# Patient Record
Sex: Female | Born: 1955 | Race: White | Hispanic: No | Marital: Married | Smoking: Never smoker
Health system: Southern US, Community
[De-identification: ages and names within clinical notes are randomized; demographics above are authoritative.]

## PROBLEM LIST (undated history)

## (undated) DIAGNOSIS — F32A Depression, unspecified: Secondary | ICD-10-CM

## (undated) DIAGNOSIS — M810 Age-related osteoporosis without current pathological fracture: Secondary | ICD-10-CM

## (undated) DIAGNOSIS — I7300 Raynaud's syndrome without gangrene: Secondary | ICD-10-CM

## (undated) HISTORY — DX: Raynaud's syndrome without gangrene: I73.00

## (undated) HISTORY — DX: Depression, unspecified: F32.A

## (undated) HISTORY — PX: HERNIA REPAIR: SHX51

## (undated) HISTORY — DX: Age-related osteoporosis without current pathological fracture: M81.0

## (undated) HISTORY — PX: TONSILECTOMY/ADENOIDECTOMY WITH MYRINGOTOMY: SHX6125

## (undated) HISTORY — PX: ABDOMINAL HYSTERECTOMY: SHX81

---

## 2012-04-28 DIAGNOSIS — D649 Anemia, unspecified: Secondary | ICD-10-CM | POA: Insufficient documentation

## 2014-06-04 DIAGNOSIS — M810 Age-related osteoporosis without current pathological fracture: Secondary | ICD-10-CM | POA: Insufficient documentation

## 2015-12-04 DIAGNOSIS — G8929 Other chronic pain: Secondary | ICD-10-CM | POA: Insufficient documentation

## 2015-12-04 DIAGNOSIS — F5101 Primary insomnia: Secondary | ICD-10-CM | POA: Insufficient documentation

## 2015-12-04 DIAGNOSIS — E559 Vitamin D deficiency, unspecified: Secondary | ICD-10-CM | POA: Insufficient documentation

## 2019-04-04 DIAGNOSIS — F331 Major depressive disorder, recurrent, moderate: Secondary | ICD-10-CM | POA: Insufficient documentation

## 2019-04-04 DIAGNOSIS — F411 Generalized anxiety disorder: Secondary | ICD-10-CM | POA: Insufficient documentation

## 2019-08-10 DIAGNOSIS — D490 Neoplasm of unspecified behavior of digestive system: Secondary | ICD-10-CM | POA: Insufficient documentation

## 2019-11-08 DIAGNOSIS — R911 Solitary pulmonary nodule: Secondary | ICD-10-CM | POA: Insufficient documentation

## 2021-01-13 DIAGNOSIS — K644 Residual hemorrhoidal skin tags: Secondary | ICD-10-CM | POA: Insufficient documentation

## 2021-01-13 DIAGNOSIS — K602 Anal fissure, unspecified: Secondary | ICD-10-CM | POA: Insufficient documentation

## 2021-04-15 DIAGNOSIS — K219 Gastro-esophageal reflux disease without esophagitis: Secondary | ICD-10-CM | POA: Insufficient documentation

## 2021-07-07 ENCOUNTER — Telehealth: Payer: Self-pay

## 2021-07-07 NOTE — Telephone Encounter (Signed)
Copied from [place] [id number]. Topic: Appointment Scheduling - New Patient >> Jul 07, 2021 11:13 AM Parke Poisson wrote: New patient has been scheduled for your office. Provider: Teodora Medici Date of Appointment: New pt  Route to department's PEC pool.

## 2021-07-24 ENCOUNTER — Telehealth: Payer: Self-pay | Admitting: Nurse Practitioner

## 2021-08-06 ENCOUNTER — Ambulatory Visit (INDEPENDENT_AMBULATORY_CARE_PROVIDER_SITE_OTHER): Payer: BLUE CROSS/BLUE SHIELD | Admitting: Nurse Practitioner

## 2021-08-06 ENCOUNTER — Other Ambulatory Visit: Payer: Self-pay

## 2021-08-06 ENCOUNTER — Encounter: Payer: Self-pay | Admitting: Nurse Practitioner

## 2021-08-06 VITALS — BP 112/72 | HR 82 | Temp 98.5°F | Resp 18 | Ht 67.0 in | Wt 123.3 lb

## 2021-08-06 DIAGNOSIS — M1990 Unspecified osteoarthritis, unspecified site: Secondary | ICD-10-CM | POA: Diagnosis not present

## 2021-08-06 DIAGNOSIS — F331 Major depressive disorder, recurrent, moderate: Secondary | ICD-10-CM

## 2021-08-06 DIAGNOSIS — E785 Hyperlipidemia, unspecified: Secondary | ICD-10-CM

## 2021-08-06 DIAGNOSIS — Z7689 Persons encountering health services in other specified circumstances: Secondary | ICD-10-CM

## 2021-08-06 DIAGNOSIS — F411 Generalized anxiety disorder: Secondary | ICD-10-CM

## 2021-08-06 DIAGNOSIS — M3501 Sicca syndrome with keratoconjunctivitis: Secondary | ICD-10-CM

## 2021-08-06 DIAGNOSIS — M810 Age-related osteoporosis without current pathological fracture: Secondary | ICD-10-CM

## 2021-08-06 DIAGNOSIS — R911 Solitary pulmonary nodule: Secondary | ICD-10-CM

## 2021-08-06 NOTE — Progress Notes (Addendum)
BP 112/72    Pulse 82    Temp 98.5 F (36.9 C) (Oral)    Resp 18    Ht 5\' 7"  (1.702 m)    Wt 123 lb 4.8 oz (55.9 kg)    SpO2 99%    BMI 19.31 kg/m    Subjective:    Patient ID: Marissa Shannon, female    DOB: 12-20-1955, 65 y.o.   MRN: [id number]  HPI: Marissa Shannon is a 65 y.o. female  Chief Complaint  Patient presents with   Establish Care   Establish care : She moved from [place] to be closer to family. Up to date on physical.    Depression/anxiety:She says she was originally placed on medication when her life was very stressful.  She was caring for her mother and working full time. She wants to wean off Lexapro.  She currently has 20 mg pills, she is going to start by cutting those in half and seeing if her mood continues to be stabilized.  She does also use Buspar as needed.  She says she has not taken it in awhile.    Depression screen [facility] 2/9 08/06/2021  Decreased Interest 0  Down, Depressed, Hopeless 0  PHQ - 2 Score 0  Altered sleeping 0  Tired, decreased energy 0  Change in appetite 0  Feeling bad or failure about yourself  0  Trouble concentrating 0  Moving slowly or fidgety/restless 0  Suicidal thoughts 0  PHQ-9 Score 0    GAD 7 : Generalized Anxiety Score 08/06/2021  Nervous, Anxious, on Edge 0  Control/stop worrying 0  Worry too much - different things 0  Trouble relaxing 0  Restless 0  Easily annoyed or irritable 0  Afraid - awful might happen 0  Total GAD 7 Score 0  Anxiety Difficulty Not difficult at all     Arthritis/Sjogren's syndrome: : She says that she has arthritis in her hands, elbows and knees.  She says her mom also had it.  She says she does not taken anything for it.  She has seen rheumatology. She was on plaquenil but she was not able to tolerate it.  She says mostly she has dry mouth but controls it by drinking water.    Hyperlipidemia:  Discussed lab results from 04/15/21.  Discussed ASCVD risk score and dietary changes that can help  improve her cholesterol.  Her LDL was 117 on 04/15/21.  The 10-year ASCVD risk score (Arnett DK, et al., 2019) is: 3.5%   Values used to calculate the score:     Age: 30 years     Sex: Female     Is Non-Hispanic African American: No     Diabetic: No     Tobacco smoker: No     Systolic Blood Pressure: 299 mmHg     Is BP treated: No     HDL Cholesterol: 71 mg/dL     Total Cholesterol: 203 mg/dL   Osteoporosis: This was noted on a bone density scan 04/2019.  She is currently on Fosamax 70 mg daily. She also takes vitamin D 5000 units daily.  No falls or fractures.  Due for next bone density next year.   Pulmonary nodule: Ct scan done on 06/25/21, showed a stable appearance of the 1 cm ground glass opacity seen in the anterior right upper lobe since 12/12/2020. Second pulmonary nodule noted on RLL 4 mm. Saw pulmonology on 07/16/21. Recommended repeat scan in one year and then every two years  for 5 years.   Relevant past medical, surgical, family and social history reviewed and updated as indicated. Interim medical history since our last visit reviewed. Allergies and medications reviewed and updated.  Review of Systems  Constitutional: Negative for fever or weight change.  Respiratory: Negative for cough and shortness of breath.   Cardiovascular: Negative for chest pain or palpitations.  Gastrointestinal: Negative for abdominal pain, no bowel changes.  Musculoskeletal: Negative for gait problem or joint swelling.  Skin: Negative for rash.  Neurological: Negative for dizziness or headache.  No other specific complaints in a complete review of systems (except as listed in HPI above).      Objective:    BP 112/72    Pulse 82    Temp 98.5 F (36.9 C) (Oral)    Resp 18    Ht 5\' 7"  (1.702 m)    Wt 123 lb 4.8 oz (55.9 kg)    SpO2 99%    BMI 19.31 kg/m   Wt Readings from Last 3 Encounters:  08/06/21 123 lb 4.8 oz (55.9 kg)    Physical Exam  Constitutional: Patient appears well-developed and  well-nourished. No distress.  HEENT: head atraumatic, normocephalic, pupils equal and reactive to light,  neck supple Cardiovascular: Normal rate, regular rhythm and normal heart sounds.  No murmur heard. No BLE edema. Pulmonary/Chest: Effort normal and breath sounds normal. No respiratory distress. Abdominal: Soft.  There is no tenderness. Psychiatric: Patient has a normal mood and affect. behavior is normal. Judgment and thought content normal.     Assessment & Plan:   1. Moderate episode of recurrent major depressive disorder (HCC) -decrease lexapro to 10 mg daily follow-up   2. Generalized anxiety disorder -continue to use Buspar prn  3. Arthritis -continue current treatment plan  4. Sjogren's syndrome with keratoconjunctivitis sicca ([place]) -continue current treatment plan  5. Hyperlipidemia, unspecified hyperlipidemia type -increase physical activity, decrease saturated fats in your diet  6. Age-related osteoporosis without current pathological fracture -continue current treatment  7. Pulmonary nodule -will do repeat ct scan next year  8. Encounter to establish care -cpe in 8 months  Follow up plan: Return in about 4 weeks (around 09/03/2021) for follow up.

## 2021-09-22 ENCOUNTER — Ambulatory Visit: Payer: Self-pay | Admitting: Internal Medicine

## 2021-11-11 ENCOUNTER — Encounter: Payer: Self-pay | Admitting: Family Medicine

## 2021-11-11 ENCOUNTER — Encounter: Payer: Self-pay | Admitting: Nurse Practitioner

## 2021-11-11 ENCOUNTER — Telehealth (INDEPENDENT_AMBULATORY_CARE_PROVIDER_SITE_OTHER): Payer: 59 | Admitting: Family Medicine

## 2021-11-11 DIAGNOSIS — J329 Chronic sinusitis, unspecified: Secondary | ICD-10-CM

## 2021-11-11 DIAGNOSIS — B9689 Other specified bacterial agents as the cause of diseases classified elsewhere: Secondary | ICD-10-CM | POA: Diagnosis not present

## 2021-11-11 MED ORDER — AMOXICILLIN-POT CLAVULANATE 875-125 MG PO TABS: 1.0000 | ORAL_TABLET | Freq: Two times a day (BID) | ORAL | 0 refills | Status: AC

## 2021-11-11 NOTE — Progress Notes (Signed)
Virtual Visit via Video Note ? ?I connected with [name] on 11/11/21 at  1:40 PM EDT by a video enabled telemedicine application and verified that I am speaking with the correct person using two identifiers. ? ?Location: ?Patient: son's house ?Provider: [facility] ?  ?I discussed the limitations of evaluation and management by telemedicine and the availability of in person appointments. The patient expressed understanding and agreed to proceed. ? ?History of Present Illness: ? ?UPPER RESPIRATORY TRACT INFECTION ?- symptom onset 2 weeks ago ?- has not tested for COVID. No known COVID contacts.  ? ?Fever: yes, Tmax 102F with body aches ?Cough: yes, productive ?Shortness of breath: no ?Chest pain: no ?Chest tightness: no ?Chest congestion: yes ?Nasal congestion: yes ?Runny nose: yes, thick mucus ?Sore throat: yes ?Sinus pressure: yes ?Headache: yes ?Face pain: yes ?Toothache: yes ?Ear pain: no  ?Ear pressure: no  ?Eyes red/itching:no ?Eye drainage/crusting: no  ?Vomiting: no, some nausea ?Rash: no ?Fatigue: yes ?Sick contacts: yes ?Relief with OTC cold/cough medications:  a little   ?Treatments attempted:  alka-seltzer cold-flu ? ?  ?Observations/Objective: ? ?Well appearing, in NAD. Speaks in full sentences. Comfortable WOB on RA. No resp distress.  ? ?Assessment and Plan: ? ?Acute Sinusitis ?Reasonable to treat with antibiotics given duration of symptoms and prolonged fever. Rx augmentin. F/u in person if no better after treatment.  ?  ?I discussed the assessment and treatment plan with the patient. The patient was provided an opportunity to ask questions and all were answered. The patient agreed with the plan and demonstrated an understanding of the instructions. ?  ?The patient was advised to call back or seek an in-person evaluation if the symptoms worsen or if the condition fails to improve as anticipated. ? ?I provided 8 minutes of non-face-to-face time during this encounter. ? ? ?Myles Gip, DO ?

## 2021-11-24 ENCOUNTER — Telehealth: Payer: Self-pay

## 2021-11-24 ENCOUNTER — Encounter: Payer: Self-pay | Admitting: Nurse Practitioner

## 2021-11-24 ENCOUNTER — Encounter: Payer: Self-pay | Admitting: Family Medicine

## 2021-11-24 NOTE — Telephone Encounter (Signed)
Pt was seen ono 4.4.2023 and her insurance was not on file. Please resubmit she stopped by and gave [place] her insurance information. Thank you

## 2021-11-24 NOTE — Telephone Encounter (Signed)
Spoke with pt and informed her that it is not showing in the system that she has uploaded the insurance information. I put the information in for her and she will stop by sometime today to being the card to scan it in.

## 2021-11-26 ENCOUNTER — Encounter: Payer: Self-pay | Admitting: Family Medicine

## 2021-11-26 ENCOUNTER — Ambulatory Visit
Admission: RE | Admit: 2021-11-26 | Discharge: 2021-11-26 | Disposition: A | Discharge status: Home / Self Care | Payer: 59 | Attending: Family Medicine | Admitting: Family Medicine

## 2021-11-26 ENCOUNTER — Ambulatory Visit (INDEPENDENT_AMBULATORY_CARE_PROVIDER_SITE_OTHER): Payer: 59 | Admitting: Family Medicine

## 2021-11-26 ENCOUNTER — Ambulatory Visit: Payer: Self-pay

## 2021-11-26 ENCOUNTER — Ambulatory Visit
Admission: RE | Admit: 2021-11-26 | Discharge: 2021-11-26 | Disposition: A | Discharge status: Home / Self Care | Payer: 59 | Source: Ambulatory Visit | Attending: Family Medicine | Admitting: Family Medicine

## 2021-11-26 VITALS — BP 102/64 | HR 98 | Temp 98.5°F | Resp 16 | Ht 67.5 in | Wt 125.5 lb

## 2021-11-26 DIAGNOSIS — R0781 Pleurodynia: Secondary | ICD-10-CM | POA: Insufficient documentation

## 2021-11-26 DIAGNOSIS — R059 Cough, unspecified: Secondary | ICD-10-CM

## 2021-11-26 DIAGNOSIS — R911 Solitary pulmonary nodule: Secondary | ICD-10-CM | POA: Diagnosis not present

## 2021-11-26 DIAGNOSIS — J209 Acute bronchitis, unspecified: Secondary | ICD-10-CM | POA: Diagnosis not present

## 2021-11-26 MED ORDER — ALBUTEROL SULFATE HFA 108 (90 BASE) MCG/ACT IN AERS
2.0000 | INHALATION_SPRAY | Freq: Four times a day (QID) | RESPIRATORY_TRACT | 0 refills | Status: AC | PRN
Start: 2021-11-26 — End: ?

## 2021-11-26 MED ORDER — ALBUTEROL SULFATE (2.5 MG/3ML) 0.083% IN NEBU
2.5000 mg | INHALATION_SOLUTION | Freq: Once | RESPIRATORY_TRACT | Status: AC
  Administered 2021-11-26: 2.5 mg via RESPIRATORY_TRACT

## 2021-11-26 MED ORDER — PREDNISONE 20 MG PO TABS: 40.0000 mg | ORAL_TABLET | Freq: Every day | ORAL | 0 refills | Status: AC

## 2021-11-26 NOTE — Telephone Encounter (Signed)
?  Chief Complaint: cough ?Symptoms: non productive cough, severe, back pain, unable to take full inhalation without pain ?Frequency: going on 1 month ?Pertinent Negatives: Patient denies SOB ?Disposition: '[]'$ ED /'[]'$ Urgent Care (no appt availability in office) / '[x]'$ Appointment(In office/virtual)/ '[]'$  [facility]/ '[]'$ Home Care/ '[]'$ Refused Recommended Disposition /'[]'$ [facility]/ '[]'$  Follow-up with PCP ?Additional Notes: pt states she has completed her abx and been taking OTC cough medicine but still unable to shake the cough and everyone in the home is better now. Pt advised to come in for OV to assess lungs and r/o bronchitis or PNA. Appt scheduled today at 1400.  ? ?Summary: ongoing cough  ? Pt called in stating she was recently seen and has a severe cough still and wanted to know what all she can take, please advise.   ?  ? ? ?Reason for Disposition ? SEVERE coughing spells (e.g., whooping sound after coughing, vomiting after coughing) ? ?Answer Assessment - Initial Assessment Questions ?1. ONSET: "When did the cough begin?"  ?    Mid March  ?3. SPUTUM: "Describe the color of your sputum" (none, dry cough; clear, white, yellow, green) ?    No dry hacky cough ?5. DIFFICULTY BREATHING: "Are you having difficulty breathing?" If Yes, ask: "How bad is it?" (e.g., mild, moderate, severe)  ?  - MILD: No SOB at rest, mild SOB with walking, speaks normally in sentences, can lie down, no retractions, pulse < 100.  ?  - MODERATE: SOB at rest, SOB with minimal exertion and prefers to sit, cannot lie down flat, speaks in phrases, mild retractions, audible wheezing, pulse 100-120.  ?  - SEVERE: Very SOB at rest, speaks in single words, struggling to breathe, sitting hunched forward, retractions, pulse > 120  ?    Mild  ?10. OTHER SYMPTOMS: "Do you have any other symptoms?" (e.g., runny nose, wheezing, chest pain) ?      Unable to take full deep breath, back pain ? ?Protocols used: Cough - Acute  Productive-A-AH ? ?

## 2021-11-26 NOTE — Progress Notes (Addendum)
? ? ?Patient ID: Marissa Shannon, female    DOB: 28-Mar-1956, 66 y.o.   MRN: [id number] ? ?PCP: Bo Merino, FNP ? ?Chief Complaint  ?Patient presents with  ? Cough  ?  Onset for over a month, at the beginning it was productive cough and now its dry. Pt states hurts mid chest when coughing and even her back.  ? ? ?Subjective:  ? ?Marissa Shannon is a 66 y.o. female, presents to clinic with CC of the following: ? ?HPI  ?Patient presents for over a month of coughing.  She states she lives with one of her children and their children about 4 kids and 4 adults in the house altogether and roughly 1 month ago they all got sick with a upper respiratory viral infection, several were seen by their PCPs and some had testing all were negative for flu & COVID ?At that time they all had fevers she reports about 2 weeks of fevers with a Tmax of 103 ?Fever stopped and she reports no recent fever or sweats but she has had continued cough.  It was productive and has now become more harsh and barking and dry ?She did try her daughter's inhaler and that helped temporarily ?She reports no history of asthma, bronchitis or significant smoking exposure or personal smoking history ?She has coughing fits she feels that her chest is sore from coughing but also she feels some discomfort with deep inspiration it feels tight and she feels somewhat wheezy and short of breath ? ?She recently established here with Almyra Free NP - no weight loss ?No hemoptysis ?She is still having some scratchy throat ?Wt Readings from Last 5 Encounters:  ?11/26/21 125 lb 8 oz (56.9 kg)  ?08/06/21 123 lb 4.8 oz (55.9 kg)  ? ?BMI Readings from Last 5 Encounters:  ?11/26/21 19.37 kg/m?  ?08/06/21 19.31 kg/m?  ? ? ? ?Patient Active Problem List  ? Diagnosis Date Noted  ? Gastroesophageal reflux disease without esophagitis 04/15/2021  ? Anal fissure 01/13/2021  ? External hemorrhoid 01/13/2021  ? Pulmonary nodule 11/08/2019  ? IPMN (intraductal papillary mucinous neoplasm)  08/10/2019  ? Generalized anxiety disorder 04/04/2019  ? Moderate episode of recurrent major depressive disorder ([place]) 04/04/2019  ? Chronic bilateral low back pain without sciatica 12/04/2015  ? Primary insomnia 12/04/2015  ? Vitamin D insufficiency 12/04/2015  ? Age-related osteoporosis without current pathological fracture 06/04/2014  ? Anemia 04/28/2012  ? Sjogren's syndrome ([place]) 04/28/2012  ? ? ? ? ?Current Outpatient Medications:  ?  alendronate (FOSAMAX) 70 MG tablet, Take by mouth., Disp: , Rfl:  ?  busPIRone (BUSPAR) 15 MG tablet, Take by mouth., Disp: , Rfl:  ?  Cholecalciferol 125 MCG (5000 UT) TABS, Take by mouth., Disp: , Rfl:  ?  cycloSPORINE (RESTASIS) 0.05 % ophthalmic emulsion, Administer 1 drop to both eyes 2 (two) times a day, Disp: , Rfl:  ?  Docusate Sodium (DSS) 100 MG CAPS, Take by mouth., Disp: , Rfl:  ?  hydrocortisone 2.5 % cream, Apply rectally 2 times daily, Disp: , Rfl:  ?  omeprazole (PRILOSEC) 40 MG capsule, Take by mouth., Disp: , Rfl:  ?  triamcinolone cream (KENALOG) 0.1 %, Apply topically., Disp: , Rfl:  ?  escitalopram (LEXAPRO) 20 MG tablet, Take by mouth. (Patient not taking: Reported on 11/11/2021), Disp: , Rfl:  ? ? ?Allergies  ?Allergen Reactions  ? Hydroxychloroquine Rash  ? ? ? ?Social History  ? ?Tobacco Use  ? Smoking status: Never  ? Smokeless  tobacco: Never  ?Vaping Use  ? Vaping Use: Never used  ?Substance Use Topics  ? Alcohol use: Never  ? Drug use: Never  ?  ? ? ?Chart Review Today: ?I personally reviewed active problem list, medication list, allergies, family history, social history, health maintenance, notes from last encounter, lab results, imaging with the patient/caregiver today. ? ? ?Review of Systems  ?Constitutional: Negative.   ?HENT: Negative.    ?Eyes: Negative.   ?Respiratory: Negative.    ?Cardiovascular: Negative.   ?Gastrointestinal: Negative.   ?Endocrine: Negative.   ?Genitourinary: Negative.   ?Musculoskeletal: Negative.   ?Skin: Negative.    ?Allergic/Immunologic: Negative.   ?Neurological: Negative.   ?Hematological: Negative.   ?Psychiatric/Behavioral: Negative.    ?All other systems reviewed and are negative. ? ?   ?Objective:  ? ?Vitals:  ? 11/26/21 1354  ?BP: 102/64  ?Pulse: 98  ?Resp: 16  ?Temp: 98.5 ?F (36.9 ?C)  ?TempSrc: Oral  ?SpO2: 98%  ?Weight: 125 lb 8 oz (56.9 kg)  ?Height: 5' 7.5" (1.715 m)  ?  ?Body mass index is 19.37 kg/m?. ? ?Physical Exam ?Vitals and nursing note reviewed.  ?Constitutional:   ?   General: She is not in acute distress. ?   Appearance: Normal appearance. She is well-developed, well-groomed and underweight. She is not ill-appearing, toxic-appearing or diaphoretic.  ?   Interventions: Face mask in place.  ?HENT:  ?   Head: Normocephalic and atraumatic.  ?   Right Ear: External ear normal.  ?   Left Ear: External ear normal.  ?Eyes:  ?   General: No scleral icterus.    ?   Right eye: No discharge.     ?   Left eye: No discharge.  ?Cardiovascular:  ?   Rate and Rhythm: Normal rate and regular rhythm.  ?   Pulses: Normal pulses.  ?   Heart sounds: Normal heart sounds.  ?Pulmonary:  ?   Effort: Pulmonary effort is normal. No respiratory distress.  ?   Breath sounds: No stridor. Wheezing present. No rhonchi or rales.  ?Abdominal:  ?   General: Bowel sounds are normal.  ?   Palpations: Abdomen is soft.  ?Skin: ?   Capillary Refill: Capillary refill takes less than 2 seconds.  ?   Coloration: Skin is not jaundiced or pale.  ?Neurological:  ?   Mental Status: She is alert. Mental status is at baseline.  ?Psychiatric:     ?   Mood and Affect: Mood normal.     ?   Behavior: Behavior normal. Behavior is cooperative.  ?  ? ?No results found for this or any previous visit. ? ?   ?Assessment & Plan:  ? ?URI with fever followed by a month of coughing - pt presented for f/up ?She has diminished BS throughout with inspiratory and exp wheeze ?She was given an neb in clinic, this did improve BS some, but she continued to have exp  wheeze ?No recurrence of her fever, no current sweats - but encouraged her to get CXR done if she is not improving and right away if worsening.   ?She notes a hx of stable lung nodule - although they were at outside facility - explained that records would be helpful for interpreting CXR because they may see it. ? ?1. Cough, unspecified type ? ?- albuterol (PROVENTIL) (2.5 MG/3ML) 0.083% nebulizer solution 2.5 mg ?- DG Chest 2 View; Future ? ?2. Pleuritic chest pain ? ?- DG Chest 2 View; Future ? ?  3. Acute bronchitis, unspecified organism ?She did try her daughter's inhaler and that helped temporarily ?She reports no history of asthma, bronchitis or significant smoking exposure or personal smoking history ?She has coughing fits she feels that her chest is sore from coughing but also she feels some discomfort with deep inspiration it feels tight and she feels somewhat wheezy and short of breath ?No hemoptysis ?She is still having some scratchy throat - advised tx with allergy meds ? ?- albuterol (PROVENTIL) (2.5 MG/3ML) 0.083% nebulizer solution 2.5 mg ?- predniSONE (DELTASONE) 20 MG tablet; Take 2 tablets (40 mg total) by mouth daily with breakfast for 5 days.  Dispense: 10 tablet; Refill: 0 ?- albuterol (VENTOLIN HFA) 108 (90 Base) MCG/ACT inhaler; Inhale 2 puffs into the lungs every 6 (six) hours as needed for wheezing or shortness of breath.  Dispense: 8 g; Refill: 0 ? ? ?close f/up if not improving ? ? ?Delsa Grana, PA-C ?11/26/21 2:08 PM ? ?

## 2021-11-28 ENCOUNTER — Encounter: Payer: Self-pay | Admitting: Family Medicine

## 2021-11-28 ENCOUNTER — Other Ambulatory Visit: Payer: Self-pay | Admitting: Family Medicine

## 2021-11-28 DIAGNOSIS — R9389 Abnormal findings on diagnostic imaging of other specified body structures: Secondary | ICD-10-CM

## 2021-11-28 DIAGNOSIS — R062 Wheezing: Secondary | ICD-10-CM

## 2021-11-28 DIAGNOSIS — R918 Other nonspecific abnormal finding of lung field: Secondary | ICD-10-CM

## 2021-11-28 DIAGNOSIS — R052 Subacute cough: Secondary | ICD-10-CM

## 2021-11-28 MED ORDER — DOXYCYCLINE HYCLATE 100 MG PO TABS: 100.0000 mg | ORAL_TABLET | Freq: Two times a day (BID) | ORAL | 0 refills | Status: AC

## 2021-11-28 NOTE — Addendum Note (Signed)
Addended by: Delsa Grana on: 11/28/2021 02:09 PM ? ? Modules accepted: Orders ? ?

## 2021-12-01 ENCOUNTER — Telehealth: Payer: Self-pay

## 2021-12-01 NOTE — Telephone Encounter (Signed)
Copied from [place] [id number]. Topic: General - Other >> Dec 01, 2021  8:57 AM McGill, Nelva Bush wrote: Reason for SNK:[id number] from [facility] wanted to make sure pt PCP Delsa Grana reviewed chest x-ray results from 11/26/2021.

## 2021-12-01 NOTE — Telephone Encounter (Signed)
Per Roselyn Reef report already reviewed on 5/21 ?

## 2021-12-02 ENCOUNTER — Ambulatory Visit
Admission: RE | Admit: 2021-12-02 | Discharge: 2021-12-02 | Disposition: A | Discharge status: Home / Self Care | Payer: 59 | Source: Ambulatory Visit | Attending: Family Medicine | Admitting: Family Medicine

## 2021-12-02 DIAGNOSIS — R911 Solitary pulmonary nodule: Secondary | ICD-10-CM | POA: Diagnosis present

## 2021-12-03 ENCOUNTER — Other Ambulatory Visit: Payer: Self-pay | Admitting: Nurse Practitioner

## 2021-12-03 ENCOUNTER — Ambulatory Visit: Payer: Self-pay | Admitting: *Deleted

## 2021-12-03 DIAGNOSIS — R918 Other nonspecific abnormal finding of lung field: Secondary | ICD-10-CM

## 2021-12-03 NOTE — Telephone Encounter (Signed)
Patient notified

## 2021-12-03 NOTE — Telephone Encounter (Signed)
Call received from [place], from [facility], [phone]. CT chest results reported to NT.  ?IMPRESSION: ?1. 10 mm right ground-glass pulmonary nodule within the upper lobe. ?Recommend a non-contrast Chest CT at 6-12 months to confirm ?persistence, then additional non-contrast Chest CTs every 2 years ?until 5 years. If nodule grows or develops solid component(s), ?consider resection. ?These guidelines do not apply to immunocompromised patients and ?patients with cancer. Follow up in patients with significant ?comorbidities as clinically warranted. For lung cancer screening, ?adhere to Lung-RADS guidelines. Reference: Radiology. 2017; ?284(1):228-43. ?2. Multifocal tree in bud nodular ground-glass airspace disease ?elsewhere in the right lung, most consistent with nonspecific ?inflammatory or infectious etiology, including chronic indolent ?infection such as MAC. This could also be reassessed at the time of ?follow-up. ?3. Bibasilar band like areas of linear consolidation most consistent ?with scarring. This accounts for the chest x-ray findings. ?4.  Aortic Atherosclerosis (ICD10-I70.0). ?  ?These results will be called to the ordering clinician or ?representative by the Radiologist Assistant, and communication ?documented in the PACS or [employer]. ?  ?FC Cassandra notified CT chest results will be sent for PCP to review.  ? ? ?

## 2021-12-03 NOTE — Telephone Encounter (Signed)
Please look at result and advise patient ?

## 2022-01-20 ENCOUNTER — Encounter: Payer: Self-pay | Admitting: Pulmonary Disease

## 2022-01-20 ENCOUNTER — Ambulatory Visit (INDEPENDENT_AMBULATORY_CARE_PROVIDER_SITE_OTHER): Payer: PPO | Admitting: Pulmonary Disease

## 2022-01-20 VITALS — BP 118/64 | HR 61 | Temp 97.9°F | Ht 67.5 in | Wt 131.4 lb

## 2022-01-20 DIAGNOSIS — M3502 Sicca syndrome with lung involvement: Secondary | ICD-10-CM | POA: Diagnosis not present

## 2022-01-20 DIAGNOSIS — R918 Other nonspecific abnormal finding of lung field: Secondary | ICD-10-CM

## 2022-01-20 DIAGNOSIS — R062 Wheezing: Secondary | ICD-10-CM | POA: Diagnosis not present

## 2022-01-20 NOTE — Patient Instructions (Signed)
We did notice some wheezing today during your exam.  We will get some breathing tests to assess for potential asthma.  We are scheduling a CT of the chest in 6 months time, we will see you in follow-up after that CT is done.

## 2022-01-20 NOTE — Progress Notes (Signed)
Subjective:    Patient ID: Marissa Shannon, female    DOB: 1956/02/05, 66 y.o.   MRN: [id number] Patient Care Team: Berniece Salines, FNP as PCP - General (Nurse Practitioner)  Chief Complaint  Patient presents with   pulmonary consult    CT 12/03/2021--no current sx.     HPI The patient is a 66 year old lifelong never smoker with a history as noted below, who presents for evaluation of a groundglass pulmonary nodule noted on CT chest performed 02 December 2021.  She is kindly referred by Della Goo, FNP.  The patient carries a diagnosis of Sjogren's syndrome.  She moved to the [place] area in December 2022 and subsequently after that I felt sick in March 2023 with cough, wheezing and sputum production.  She saw her primary care and a chest x-ray was performed on 26 November 2021.  This showed a possible nodular opacity in the left lower lung and a CT was recommended.  This was followed by a CT chest on 25 April which showed a 10 mm right ground glass pulmonary nodule in the upper lobe and some multifocal 3 and blood nodular groundglass airspace disease elsewhere in the right lung consistent with inflammatory or infectious etiology.  She also had some bibasilar scarring/atelectasis.  It is recommended that she have a CT chest follow-up in 6 to 12 months given the groundglass opacity on the right lung.  The patient has resolved the symptoms that she initially had.  She has albuterol to use as rescue however has not used it lately.  This was given to her during her episode of illness.  The patient reports that she had COVID-19 in November 2022 that affected mostly her left lung.  This was while she was still residing in [place].  She states that approximately a year or 2 ago she was told that she had a "spot on her right lung" but the studies were done in [place].  Through Care Everywhere it appears that she has had a right upper lobe groundglass opacity that has been unchanged since around  2021.  Films were done in [facility] in [place].  The patient was followed by Dr. Galvin Proffer, DO, pulmonologist in [facility].   Review of Systems A 10 point review of systems was performed and it is as noted above otherwise negative.  Past Medical History:  Diagnosis Date   Depression    Osteoporosis    Raynaud disease    Past Surgical History:  Procedure Laterality Date   ABDOMINAL HYSTERECTOMY     CESAREAN SECTION     3 times   HERNIA REPAIR     TONSILECTOMY/ADENOIDECTOMY WITH MYRINGOTOMY     Patient Active Problem List   Diagnosis Date Noted   Gastroesophageal reflux disease without esophagitis 04/15/2021   Anal fissure 01/13/2021   External hemorrhoid 01/13/2021   Pulmonary nodule 11/08/2019   IPMN (intraductal papillary mucinous neoplasm) 08/10/2019   Generalized anxiety disorder 04/04/2019   Moderate episode of recurrent major depressive disorder (HCC) 04/04/2019   Chronic bilateral low back pain without sciatica 12/04/2015   Primary insomnia 12/04/2015   Vitamin D insufficiency 12/04/2015   Age-related osteoporosis without current pathological fracture 06/04/2014   Anemia 04/28/2012   Sjogren's syndrome (HCC) 04/28/2012   Family History  Problem Relation Age of Onset   Parkinson's disease Mother    Cancer Brother    Social History   Tobacco Use   Smoking status: Never   Smokeless tobacco: Never  Substance Use Topics   Alcohol use: Never   Current Meds  Medication Sig   alendronate (FOSAMAX) 70 MG tablet Take by mouth.   Cholecalciferol 125 MCG (5000 UT) TABS Take by mouth.   cycloSPORINE (RESTASIS) 0.05 % ophthalmic emulsion Administer 1 drop to both eyes 2 (two) times a day   Docusate Sodium (DSS) 100 MG CAPS Take by mouth.   omeprazole (PRILOSEC) 40 MG capsule Take by mouth.   . Immunization History  Administered Date(s) Administered   Influenza Split 06/04/2013, 06/14/2014   Influenza, Quadrivalent, Recombinant, Inj, Pf 05/01/2020    Influenza,inj,Quad PF,6+ Mos 06/08/2016   Influenza-Unspecified 05/13/2021   PFIZER Comirnaty(Gray Top)Covid-19 Tri-Sucrose Vaccine 12/04/2020   PFIZER(Purple Top)SARS-COV-2 Vaccination 08/31/2019, 09/18/2019, 05/06/2020   Pfizer Covid-19 Vaccine Bivalent Booster 1yrs & up 05/13/2021   Pneumococcal Conjugate-13 12/04/2015   Pneumococcal Polysaccharide-23 04/04/2019   Tdap 12/04/2015   Zoster Recombinat (Shingrix) 05/06/2019, 08/20/2019       Objective:   Physical Exam BP 118/64 (BP Location: Left Arm, Cuff Size: Normal)   Pulse 61   Temp 97.9 F (36.6 C) (Temporal)   Ht 5' 7.5" (1.715 m)   Wt 131 lb 6.4 oz (59.6 kg)   SpO2 97%   BMI 20.28 kg/m  GENERAL: Well-developed, well-nourished woman, no acute distress.  No conversational dyspnea. HEAD: Normocephalic, atraumatic.  EYES: Pupils equal, round, reactive to light.  No scleral icterus.  MOUTH: Mucous membranes moist. NECK: Supple. No thyromegaly. Trachea midline. No JVD.  No adenopathy. PULMONARY: Good air entry bilaterally.  Rare end expiratory wheezes noted. CARDIOVASCULAR: S1 and S2. Regular rate and rhythm.  No rubs, murmurs or gallops heard. ABDOMEN: Benign. MUSCULOSKELETAL: No joint deformity, no clubbing, no edema.  NEUROLOGIC: No overt focal deficit, no gait disturbance, speech is fluent. SKIN: Intact,warm,dry. PSYCH: Mood and behavior normal.     Assessment & Plan:     ICD-10-CM   1. Multiple lung nodules on CT  R91.8 CT CHEST WO CONTRAST   Will repeat chest CT in 6 months Follow-up after chest CT    2. Wheezing  R06.2 Pulmonary Function Test [facility] Only   Use albuterol as needed Will obtain PFTs    3. Sjogren's syndrome with lung involvement (HCC)  M35.02    Possible PFTs should help clarify This issue adds complexity to her management     Orders Placed This Encounter  Procedures   CT CHEST WO CONTRAST    Standing Status:   Future    Standing Expiration Date:   01/21/2023    Scheduling Instructions:      62mo    Order Specific Question:   Preferred imaging location?    Answer:   [facility]   Pulmonary Function Test [facility] Only    Standing Status:   Future    Number of Occurrences:   1    Standing Expiration Date:   01/21/2023    Order Specific Question:   Full PFT: includes the following: basic spirometry, spirometry pre & post bronchodilator, diffusion capacity (DLCO), lung volumes    Answer:   Full PFT   Will see the patient in follow-up in 6 months time she is to contact us prior to that time should any new difficulties arise.  Gailen Shelter, MD Advanced Bronchoscopy PCCM [facility]    *This note was dictated using voice recognition software/Dragon.  Despite best efforts to proofread, errors can occur which can change the meaning. Any transcriptional errors that result from this process are unintentional  and may not be fully corrected at the time of dictation.

## 2022-02-03 ENCOUNTER — Ambulatory Visit: Payer: PPO | Attending: Pulmonary Disease

## 2022-02-03 DIAGNOSIS — R062 Wheezing: Secondary | ICD-10-CM | POA: Diagnosis not present

## 2022-02-03 DIAGNOSIS — R0602 Shortness of breath: Secondary | ICD-10-CM | POA: Insufficient documentation

## 2022-02-03 LAB — PULMONARY FUNCTION TEST ARMC ONLY
DL/VA % pred: 109 %
DL/VA: 4.46 ml/min/mmHg/L
DLCO unc % pred: 100 %
DLCO unc: 22.3 ml/min/mmHg
FEF 25-75 Post: 2.7 L/sec
FEF 25-75 Pre: 1.77 L/sec
FEF2575-%Change-Post: 52 %
FEF2575-%Pred-Post: 115 %
FEF2575-%Pred-Pre: 75 %
FEV1-%Change-Post: 16 %
FEV1-%Pred-Post: 89 %
FEV1-%Pred-Pre: 76 %
FEV1-Post: 2.46 L
FEV1-Pre: 2.1 L
FEV1FVC-%Change-Post: 14 %
FEV1FVC-%Pred-Pre: 92 %
FEV6-%Change-Post: 7 %
FEV6-%Pred-Post: 87 %
FEV6-%Pred-Pre: 81 %
FEV6-Post: 3.02 L
FEV6-Pre: 2.81 L
FEV6FVC-%Change-Post: 0 %
FEV6FVC-%Pred-Post: 103 %
FEV6FVC-%Pred-Pre: 103 %
FVC-%Change-Post: 2 %
FVC-%Pred-Post: 83 %
FVC-%Pred-Pre: 81 %
FVC-Post: 3.02 L
FVC-Pre: 2.95 L
Post FEV1/FVC ratio: 81 %
Post FEV6/FVC ratio: 100 %
Pre FEV1/FVC ratio: 71 %
Pre FEV6/FVC Ratio: 99 %
RV % pred: 123 %
RV: 2.8 L
TLC % pred: 101 %
TLC: 5.66 L

## 2022-02-03 MED ORDER — ALBUTEROL SULFATE (2.5 MG/3ML) 0.083% IN NEBU
2.5000 mg | INHALATION_SOLUTION | Freq: Once | RESPIRATORY_TRACT | Status: AC
  Administered 2022-02-03: 2.5 mg via RESPIRATORY_TRACT

## 2022-04-06 ENCOUNTER — Telehealth: Payer: Self-pay | Admitting: Nurse Practitioner

## 2022-04-06 ENCOUNTER — Ambulatory Visit (INDEPENDENT_AMBULATORY_CARE_PROVIDER_SITE_OTHER): Payer: PPO | Admitting: Nurse Practitioner

## 2022-04-06 ENCOUNTER — Other Ambulatory Visit: Payer: Self-pay | Admitting: Emergency Medicine

## 2022-04-06 ENCOUNTER — Other Ambulatory Visit: Payer: Self-pay | Admitting: Nurse Practitioner

## 2022-04-06 ENCOUNTER — Encounter: Payer: Self-pay | Admitting: Nurse Practitioner

## 2022-04-06 VITALS — BP 114/70 | HR 77 | Temp 98.2°F | Resp 16 | Ht 68.0 in | Wt 132.3 lb

## 2022-04-06 DIAGNOSIS — Z131 Encounter for screening for diabetes mellitus: Secondary | ICD-10-CM | POA: Diagnosis not present

## 2022-04-06 DIAGNOSIS — E785 Hyperlipidemia, unspecified: Secondary | ICD-10-CM | POA: Diagnosis not present

## 2022-04-06 DIAGNOSIS — Z0000 Encounter for general adult medical examination without abnormal findings: Secondary | ICD-10-CM | POA: Diagnosis not present

## 2022-04-06 DIAGNOSIS — Z130 Encounter for screening for diseases of the blood and blood-forming organs and certain disorders involving the immune mechanism: Secondary | ICD-10-CM

## 2022-04-06 DIAGNOSIS — Z114 Encounter for screening for human immunodeficiency virus [HIV]: Secondary | ICD-10-CM | POA: Diagnosis not present

## 2022-04-06 DIAGNOSIS — M810 Age-related osteoporosis without current pathological fracture: Secondary | ICD-10-CM

## 2022-04-06 DIAGNOSIS — Z1159 Encounter for screening for other viral diseases: Secondary | ICD-10-CM

## 2022-04-06 MED ORDER — ALENDRONATE SODIUM 70 MG PO TABS
70.0000 mg | ORAL_TABLET | ORAL | 3 refills | Status: AC
Start: 2022-04-06 — End: ?

## 2022-04-06 NOTE — Telephone Encounter (Signed)
done

## 2022-04-06 NOTE — Telephone Encounter (Signed)
Pt forgot to tell you she needs refill on her Fosamax please . Pharm is Writer at [facility]

## 2022-04-06 NOTE — Progress Notes (Signed)
Name: Marissa Shannon   MRN: [id number]    DOB: [birth year]   Date:04/06/2022       Progress Note  Subjective  Chief Complaint  Chief Complaint  Patient presents with   Annual Exam    HPI  Patient presents for annual CPE.  Diet: Vegetables, baked meat, some desserts, well balanced Exercise: walking 2-3 times a week for about 20-30 minutes  Sleep: 5-7 hrs Last dental exam:it has been awhile Last eye exam: a couple weeks ago  [employer] Visit from 04/06/2022 in [facility]  AUDIT-C Score 0      Depression: Phq 9 is  negative    04/06/2022   10:11 AM 11/26/2021    1:53 PM 11/11/2021    1:14 PM 08/06/2021    1:36 PM  Depression screen PHQ 2/9  Decreased Interest 0 0 0 0  Down, Depressed, Hopeless 0 0 0 0  PHQ - 2 Score 0 0 0 0  Altered sleeping 0 0 0 0  Tired, decreased energy 0 0 0 0  Change in appetite 0 0 0 0  Feeling bad or failure about yourself  0 0 0 0  Trouble concentrating 0 0 0 0  Moving slowly or fidgety/restless 0 0 0 0  Suicidal thoughts 0 0 0 0  PHQ-9 Score 0 0 0 0  Difficult doing work/chores Not difficult at all Not difficult at all Not difficult at all    Hypertension: BP Readings from Last 3 Encounters:  04/06/22 114/70  01/20/22 118/64  11/26/21 102/64   Obesity: Wt Readings from Last 3 Encounters:  04/06/22 132 lb 4.8 oz (60 kg)  01/20/22 131 lb 6.4 oz (59.6 kg)  11/26/21 125 lb 8 oz (56.9 kg)   BMI Readings from Last 3 Encounters:  04/06/22 20.12 kg/m  01/20/22 20.28 kg/m  11/26/21 19.37 kg/m     Vaccines:  HPV: up to at age 104 , ask insurance if age between 17-45  Shingrix: 22-64 yo and ask insurance if covered when patient above 79 yo Pneumonia:  educated and discussed with patient. Flu:  educated and discussed with patient.  Hep C Screening: ordered STD testing and prevention (HIV/chl/gon/syphilis): ordered Intimate partner violence:none Sexual History : yes Menstrual History/LMP/Abnormal Bleeding:  hysterectomy Incontinence Symptoms: none  Breast cancer:  - Last Mammogram: 07/01/2021, she is moving back to [place] and will get that done there - BRCA gene screening: none  Osteoporosis: Discussed high calcium and vitamin D supplementation, weight bearing exercises  Cervical cancer screening: hysterectomy  Skin cancer: Discussed monitoring for atypical lesions  Colorectal cancer: 03/21/2021   Lung cancer:   Low Dose CT Chest recommended if Age 36-80 years, 20 pack-year currently smoking OR have quit w/in 15years. Patient does not qualify.   ECG: none  Advanced Care Planning: A voluntary discussion about advance care planning including the explanation and discussion of advance directives.  Discussed health care proxy and Living will, and the patient was able to identify a health care proxy as husband.  Patient does have a living will at present time. If patient does have living will, I have requested they bring this to the clinic to be scanned in to their chart.  Lipids: No results found for: "CHOL" No results found for: "HDL" No results found for: "LDLCALC" No results found for: "TRIG" No results found for: "CHOLHDL" No results found for: "LDLDIRECT"  Glucose: No results found for: "GLUCOSE", "GLUCAP"  Patient Active Problem List   Diagnosis  Date Noted   Gastroesophageal reflux disease without esophagitis 04/15/2021   Anal fissure 01/13/2021   External hemorrhoid 01/13/2021   Pulmonary nodule 11/08/2019   IPMN (intraductal papillary mucinous neoplasm) 08/10/2019   Generalized anxiety disorder 04/04/2019   Moderate episode of recurrent major depressive disorder ([place]) 04/04/2019   Chronic bilateral low back pain without sciatica 12/04/2015   Primary insomnia 12/04/2015   Vitamin D insufficiency 12/04/2015   Age-related osteoporosis without current pathological fracture 06/04/2014   Anemia 04/28/2012   Sjogren's syndrome ([place]) 04/28/2012    Past Surgical History:   Procedure Laterality Date   ABDOMINAL HYSTERECTOMY     CESAREAN SECTION     3 times   HERNIA REPAIR     TONSILECTOMY/ADENOIDECTOMY WITH MYRINGOTOMY      Family History  Problem Relation Age of Onset   Parkinson's disease Mother    Cancer Brother     Social History   Socioeconomic History   Marital status: Married    Spouse name: Not on file   Number of children: 3   Years of education: Not on file   Highest education level: Not on file  Occupational History   Not on file  Tobacco Use   Smoking status: Never   Smokeless tobacco: Never  Vaping Use   Vaping Use: Never used  Substance and Sexual Activity   Alcohol use: Never   Drug use: Never   Sexual activity: Yes  Other Topics Concern   Not on file  Social History Narrative   Not on file   Social Determinants of Health   Financial Resource Strain: Low Risk  (04/06/2022)   Overall Financial Resource Strain (CARDIA)    Difficulty of Paying Living Expenses: Not hard at all  Food Insecurity: No Food Insecurity (04/06/2022)   Hunger Vital Sign    Worried About Running Out of Food in the Last Year: Never true    Ran Out of Food in the Last Year: Never true  Transportation Needs: No Transportation Needs (04/06/2022)   PRAPARE - Transportation    Lack of Transportation (Medical): No    Lack of Transportation (Non-Medical): No  Physical Activity: Insufficiently Active (04/06/2022)   Exercise Vital Sign    Days of Exercise per Week: 2 days    Minutes of Exercise per Session: 30 min  Stress: Stress Concern Present (04/06/2022)   [place]    Feeling of Stress : To some extent  Social Connections: Moderately Isolated (04/06/2022)   Social Connection and Isolation Panel [NHANES]    Frequency of Communication with Friends and Family: More than three times a week    Frequency of Social Gatherings with Friends and Family: More than three times a week    Attends  Religious Services: Never    Marine scientist or Organizations: No    Attends Archivist Meetings: Never    Marital Status: Married  Human resources officer Violence: Not At Risk (04/06/2022)   Humiliation, Afraid, Rape, and Kick questionnaire    Fear of Current or Ex-Partner: No    Emotionally Abused: No    Physically Abused: No    Sexually Abused: No     Current Outpatient Medications:    alendronate (FOSAMAX) 70 MG tablet, Take by mouth., Disp: , Rfl:    busPIRone (BUSPAR) 15 MG tablet, Take by mouth., Disp: , Rfl:    Cholecalciferol 125 MCG (5000 UT) TABS, Take by mouth., Disp: , Rfl:  cycloSPORINE (RESTASIS) 0.05 % ophthalmic emulsion, Administer 1 drop to both eyes 2 (two) times a day, Disp: , Rfl:    Docusate Sodium (DSS) 100 MG CAPS, Take by mouth., Disp: , Rfl:    omeprazole (PRILOSEC) 40 MG capsule, Take by mouth., Disp: , Rfl:    albuterol (VENTOLIN HFA) 108 (90 Base) MCG/ACT inhaler, Inhale 2 puffs into the lungs every 6 (six) hours as needed for wheezing or shortness of breath. (Patient not taking: Reported on 04/06/2022), Disp: 8 g, Rfl: 0   escitalopram (LEXAPRO) 20 MG tablet, Take by mouth. (Patient not taking: Reported on 04/06/2022), Disp: , Rfl:    triamcinolone cream (KENALOG) 0.1 %, Apply topically. (Patient not taking: Reported on 04/06/2022), Disp: , Rfl:   Allergies  Allergen Reactions   Hydroxychloroquine Rash     ROS  Constitutional: Negative for fever or weight change.  Respiratory: Negative for cough and shortness of breath.   Cardiovascular: Negative for chest pain or palpitations.  Gastrointestinal: Negative for abdominal pain, no bowel changes.  Musculoskeletal: Negative for gait problem or joint swelling.  Skin: Negative for rash.  Neurological: Negative for dizziness or headache.  No other specific complaints in a complete review of systems (except as listed in HPI above).   Objective  Vitals:   04/06/22 1013  BP: 114/70  Pulse: 77   Resp: 16  Temp: 98.2 F (36.8 C)  TempSrc: Oral  SpO2: 99%  Weight: 132 lb 4.8 oz (60 kg)  Height: $Remove'5\' 8"'hkjcJqH$  (1.727 m)    Body mass index is 20.12 kg/m.  Physical Exam Constitutional: Patient appears well-developed and well-nourished. No distress.  HENT: Head: Normocephalic and atraumatic. Ears: B TMs ok, no erythema or effusion; Nose: Nose normal. Mouth/Throat: Oropharynx is clear and moist. No oropharyngeal exudate.  Eyes: Conjunctivae and EOM are normal. Pupils are equal, round, and reactive to light. No scleral icterus.  Neck: Normal range of motion. Neck supple. No JVD present. No thyromegaly present.  Cardiovascular: Normal rate, regular rhythm and normal heart sounds.  No murmur heard. No BLE edema. Pulmonary/Chest: Effort normal and breath sounds normal. No respiratory distress. Abdominal: Soft. Bowel sounds are normal, no distension. There is no tenderness. no masses Breast: no lumps or masses, no nipple discharge or rashes Musculoskeletal: Normal range of motion, no joint effusions. No gross deformities Neurological: he is alert and oriented to person, place, and time. No cranial nerve deficit. Coordination, balance, strength, speech and gait are normal.  Skin: Skin is warm and dry. No rash noted. No erythema.  Psychiatric: Patient has a normal mood and affect. behavior is normal. Judgment and thought content normal.   Recent Results (from the past 2160 hour(s))  Pulmonary Function Test [facility] Only     Status: None   Collection Time: 02/03/22 11:28 AM  Result Value Ref Range   FVC-Pre 2.95 L   FVC-%Pred-Pre 81 %   FVC-Post 3.02 L   FVC-%Pred-Post 83 %   FVC-%Change-Post 2 %   FEV1-Pre 2.10 L   FEV1-%Pred-Pre 76 %   FEV1-Post 2.46 L   FEV1-%Pred-Post 89 %   FEV1-%Change-Post 16 %   FEV6-Pre 2.81 L   FEV6-%Pred-Pre 81 %   FEV6-Post 3.02 L   FEV6-%Pred-Post 87 %   FEV6-%Change-Post 7 %   Pre FEV1/FVC ratio 71 %   FEV1FVC-%Pred-Pre 92 %   Post FEV1/FVC ratio 81 %    FEV1FVC-%Change-Post 14 %   Pre FEV6/FVC Ratio 99 %   FEV6FVC-%Pred-Pre 103 %   Post  FEV6/FVC ratio 100 %   FEV6FVC-%Pred-Post 103 %   FEV6FVC-%Change-Post 0 %   FEF 25-75 Pre 1.77 L/sec   FEF2575-%Pred-Pre 75 %   FEF 25-75 Post 2.70 L/sec   FEF2575-%Pred-Post 115 %   FEF2575-%Change-Post 52 %   RV 2.80 L   RV % pred 123 %   TLC 5.66 L   TLC % pred 101 %   DLCO unc 22.30 ml/min/mmHg   DLCO unc % pred 100 %   DL/VA 4.46 ml/min/mmHg/L   DL/VA % pred 109 %     Fall Risk:    04/06/2022   10:11 AM 11/26/2021    1:53 PM 11/11/2021    1:14 PM 08/06/2021    1:36 PM  Fall Risk   Falls in the past year? 0 0 0 0  Number falls in past yr: 0 0 0 0  Injury with Fall? 0 0 0 0  Risk for fall due to : No Fall Risks No Fall Risks    Follow up Falls prevention discussed;Education provided Falls prevention discussed;Education provided  Falls evaluation completed     Functional Status Survey: Is the patient deaf or have difficulty hearing?: No Does the patient have difficulty seeing, even when wearing glasses/contacts?: No Does the patient have difficulty concentrating, remembering, or making decisions?: No Does the patient have difficulty walking or climbing stairs?: No Does the patient have difficulty dressing or bathing?: No Does the patient have difficulty doing errands alone such as visiting a doctor's office or shopping?: No   Assessment & Plan  1. Annual physical exam  - Lipid panel - CBC with Differential/Platelet - COMPLETE METABOLIC PANEL WITH GFR - Hemoglobin A1c - Hepatitis C antibody - HIV Antibody (routine testing w rflx)  2. Hyperlipidemia, unspecified hyperlipidemia type  - Lipid panel  3. Screening for diabetes mellitus  - COMPLETE METABOLIC PANEL WITH GFR - Hemoglobin A1c  4. Screening for HIV without presence of risk factors  - HIV Antibody (routine testing w rflx)  5. Encounter for hepatitis C screening test for low risk patient  - Hepatitis C  antibody  6. Screening for deficiency anemia  - CBC with Differential/Platelet   -USPSTF grade A and B recommendations reviewed with patient; age-appropriate recommendations, preventive care, screening tests, etc discussed and encouraged; healthy living encouraged; see AVS for patient education given to patient -Discussed importance of 150 minutes of physical activity weekly, eat two servings of fish weekly, eat one serving of tree nuts ( cashews, pistachios, pecans, almonds.Marland Kitchen) every other day, eat 6 servings of fruit/vegetables daily and drink plenty of water and avoid sweet beverages.

## 2022-04-07 ENCOUNTER — Encounter: Payer: Self-pay | Admitting: Nurse Practitioner

## 2022-04-07 LAB — CBC WITH DIFFERENTIAL/PLATELET
Absolute Monocytes: 432 cells/uL (ref 200–950)
Basophils Absolute: 32 cells/uL (ref 0–200)
Basophils Relative: 0.7 %
Eosinophils Absolute: 193 cells/uL (ref 15–500)
Eosinophils Relative: 4.2 %
HCT: 40.1 % (ref 35.0–45.0)
Hemoglobin: 13.5 g/dL (ref 11.7–15.5)
Lymphs Abs: 635 cells/uL — ABNORMAL LOW (ref 850–3900)
MCH: 31.8 pg (ref 27.0–33.0)
MCHC: 33.7 g/dL (ref 32.0–36.0)
MCV: 94.4 fL (ref 80.0–100.0)
MPV: 10.3 fL (ref 7.5–12.5)
Monocytes Relative: 9.4 %
Neutro Abs: 3307 cells/uL (ref 1500–7800)
Neutrophils Relative %: 71.9 %
Platelets: 321 10*3/uL (ref 140–400)
RBC: 4.25 10*6/uL (ref 3.80–5.10)
RDW: 12.5 % (ref 11.0–15.0)
Total Lymphocyte: 13.8 %
WBC: 4.6 10*3/uL (ref 3.8–10.8)

## 2022-04-07 LAB — COMPLETE METABOLIC PANEL WITH GFR
AG Ratio: 1.2 (calc) (ref 1.0–2.5)
ALT: 20 U/L (ref 6–29)
AST: 18 U/L (ref 10–35)
Albumin: 4.1 g/dL (ref 3.6–5.1)
Alkaline phosphatase (APISO): 58 U/L (ref 37–153)
BUN: 13 mg/dL (ref 7–25)
CO2: 26 mmol/L (ref 20–32)
Calcium: 9.5 mg/dL (ref 8.6–10.4)
Chloride: 105 mmol/L (ref 98–110)
Creat: 0.81 mg/dL (ref 0.50–1.05)
Globulin: 3.4 g/dL (calc) (ref 1.9–3.7)
Glucose, Bld: 83 mg/dL (ref 65–99)
Potassium: 5 mmol/L (ref 3.5–5.3)
Sodium: 141 mmol/L (ref 135–146)
Total Bilirubin: 0.4 mg/dL (ref 0.2–1.2)
Total Protein: 7.5 g/dL (ref 6.1–8.1)
eGFR: 81 mL/min/{1.73_m2} (ref >=60)

## 2022-04-07 LAB — LIPID PANEL
Cholesterol: 224 mg/dL — ABNORMAL HIGH (ref <200)
HDL: 77 mg/dL (ref >=50)
LDL Cholesterol (Calc): 130 mg/dL (calc) — ABNORMAL HIGH
Non-HDL Cholesterol (Calc): 147 mg/dL (calc) — ABNORMAL HIGH (ref <130)
Total CHOL/HDL Ratio: 2.9 (calc) (ref <5.0)
Triglycerides: 78 mg/dL (ref <150)

## 2022-04-07 LAB — HEMOGLOBIN A1C
Hgb A1c MFr Bld: 5.4 % of total Hgb (ref <5.7)
Mean Plasma Glucose: 108 mg/dL
eAG (mmol/L): 6 mmol/L

## 2022-04-07 LAB — HEPATITIS C ANTIBODY: Hepatitis C Ab: NONREACTIVE

## 2022-04-07 LAB — HIV ANTIBODY (ROUTINE TESTING W REFLEX): HIV 1&2 Ab, 4th Generation: NONREACTIVE

## 2022-07-27 ENCOUNTER — Ambulatory Visit: Payer: PPO

## 2022-12-11 IMAGING — CT CT CHEST W/O CM
2 of 4 series · 14 of 36 positions shown, 17 images · non-contrast
Comparison: 11/26/2021

CLINICAL DATA: Cough for several months, failed in. Treatment,
wheeze, abnormal chest x-ray



[Series 2: chest 2.00 · axial · 0.50mm/px · z∈[-1131,-857]mm · 11 of 163 slices shown, 14 images]
[im 13/163  mediastinal]
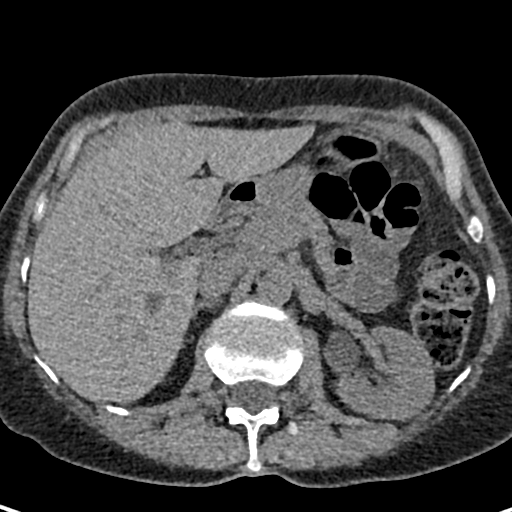
[im 13/163  lung]
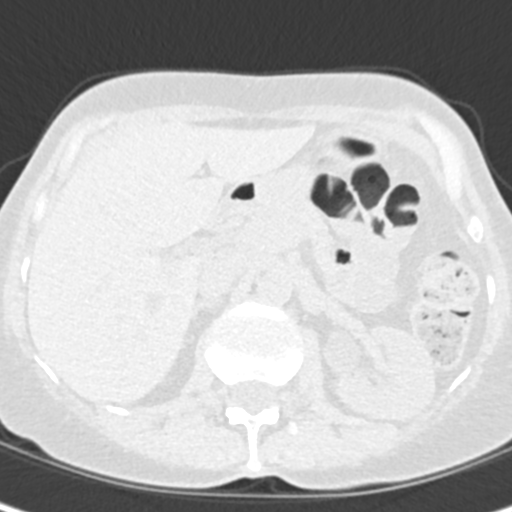
[im 25/163  lung]
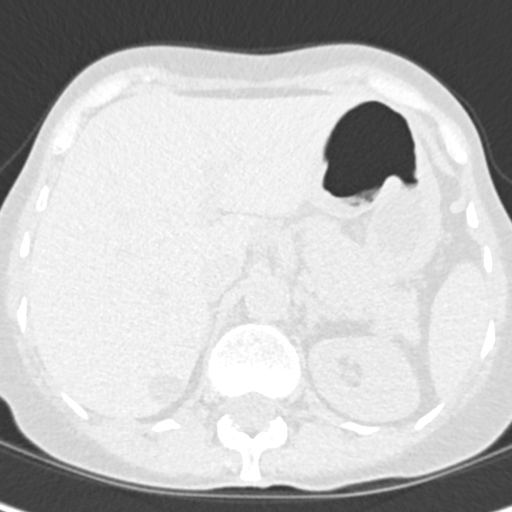
[im 38/163  lung]
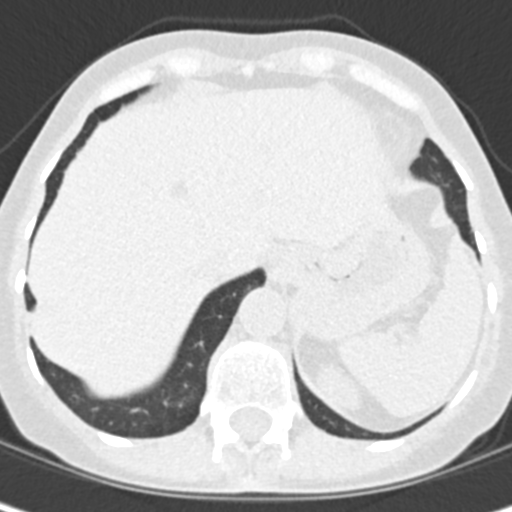
[im 50/163  lung]
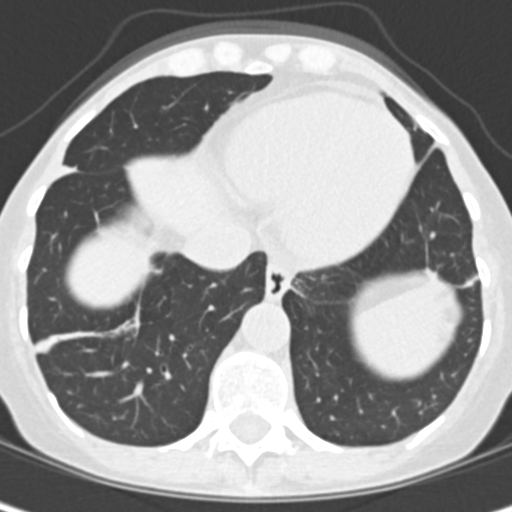
[im 63/163  mediastinal]
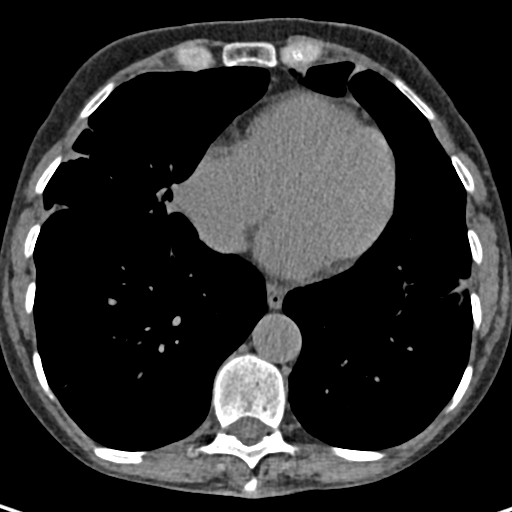
[im 63/163  lung]
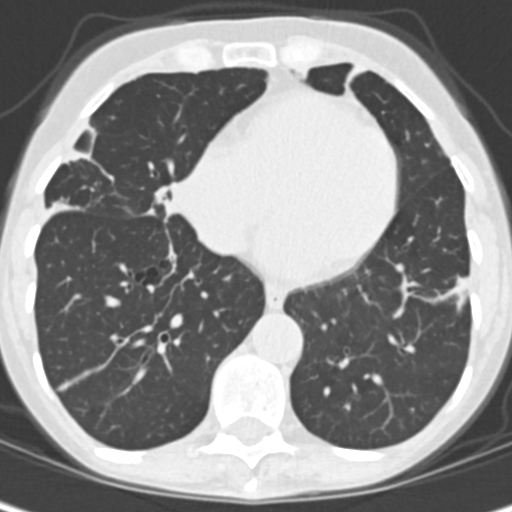
[im 88/163  lung]
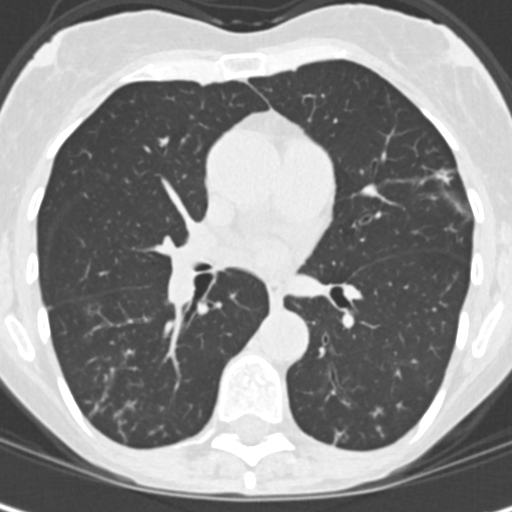
[im 100/163  lung]
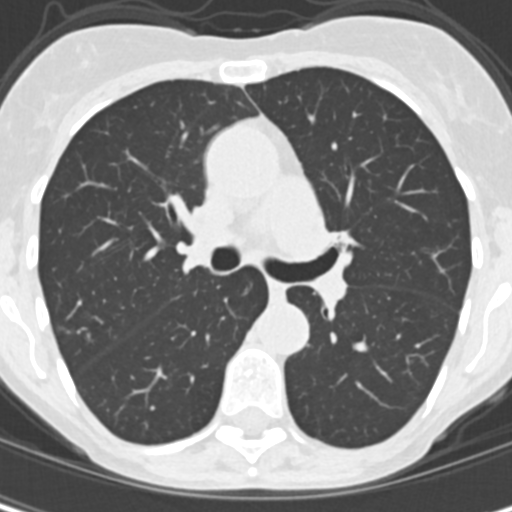
[im 113/163  lung]
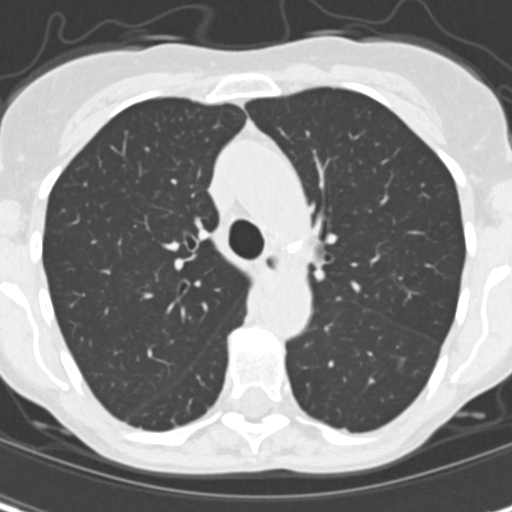
[im 125/163  mediastinal]
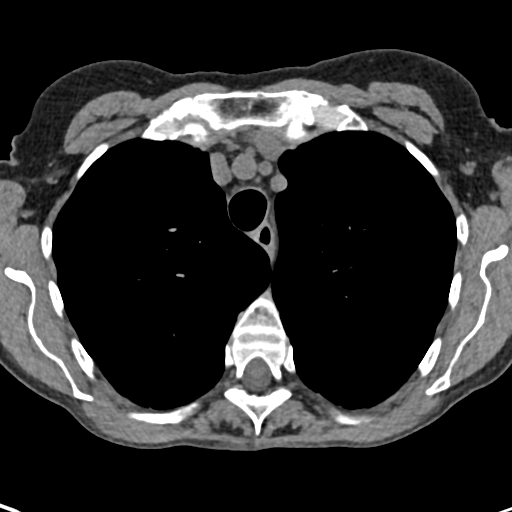
[im 125/163  lung]
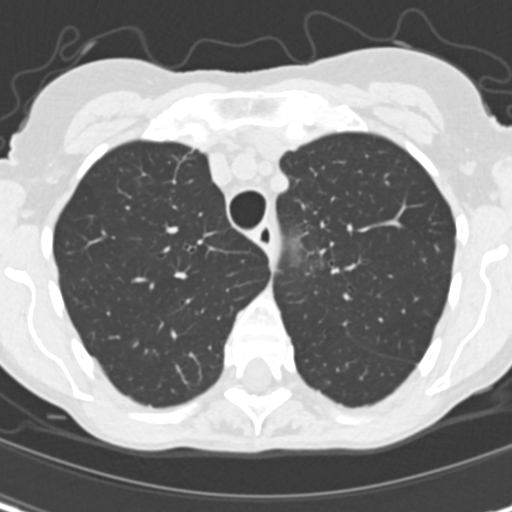
[im 138/163  lung]
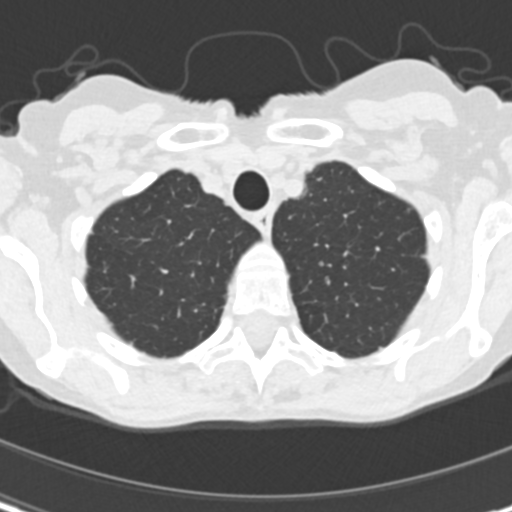
[im 150/163  lung]
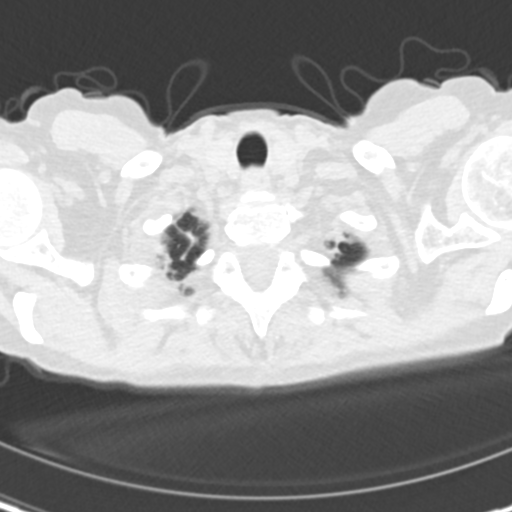

[Series 5: coronals chest 2.00 cor · coronal · 0.50mm/px · 3 of 126 slices shown]
[im 26/126  lung]
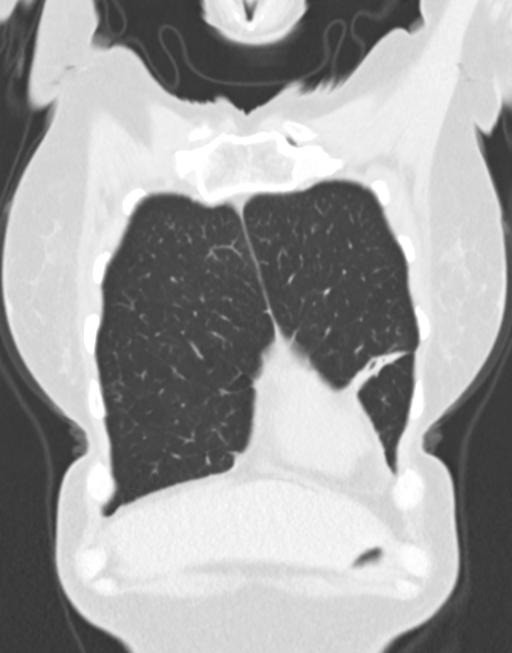
[im 51/126  lung]
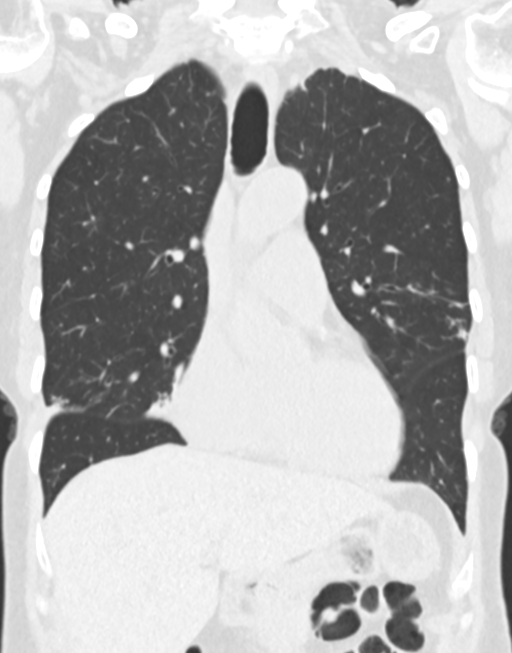
[im 76/126  lung]
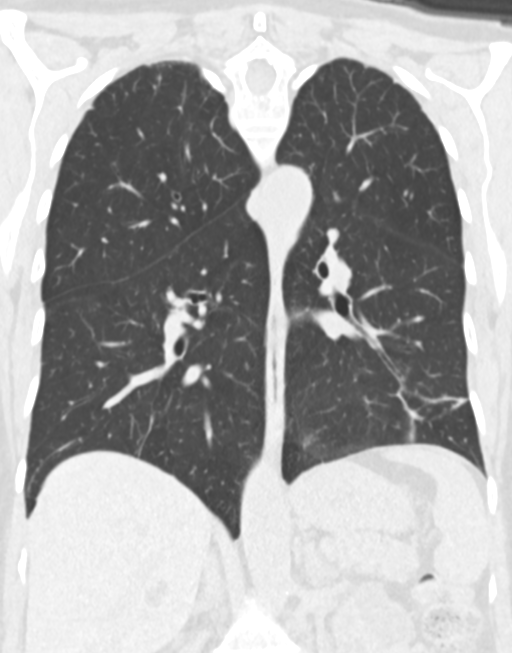

[14 of 36 positions shown; findings below may reference images not displayed]

FINDINGS: Cardiovascular: Limited unenhanced imaging of the heart and great
vessels demonstrates no pericardial effusion. Normal caliber of the
thoracic aorta. Mild atherosclerosis of the aortic arch and coronary
vasculature. Evaluation of the vascular lumen limited without IV
contrast.

Mediastinum/Nodes: No enlarged mediastinal or axillary lymph nodes.
Thyroid gland, trachea, and esophagus demonstrate no significant
findings.

Lungs/Pleura: There is a pure ground-glass nodule within the right
upper lobe, measuring 10 x 9 x 11 mm, reference image 36/3. This
will require follow-up.

Tree in bud nodular ground-glass airspace disease is seen elsewhere
within the right lung, most pronounced within the right middle lobe
and superior segment right lower lobe, consistent with nonspecific
inflammatory or infectious etiology.

Linear areas of consolidation are seen within the lingula, right
middle lobe, and bilateral lower lobes, corresponding to density
seen on prior chest x-ray, most consistent with scarring.

No effusion or pneumothorax.  The central airways are patent.

Upper Abdomen: No acute abnormality.

Musculoskeletal: No acute or destructive bony lesions. Reconstructed
images demonstrate no additional findings.
IMPRESSION: 1. 10 mm right ground-glass pulmonary nodule within the upper lobe.
Recommend a non-contrast Chest CT at 6-12 months to confirm
persistence, then additional non-contrast Chest CTs every 2 years
until 5 years. If nodule grows or develops solid component(s),
consider resection.
These guidelines do not apply to immunocompromised patients and
patients with cancer. Follow up in patients with significant
comorbidities as clinically warranted. For lung cancer screening,
adhere to Lung-RADS guidelines. Reference: Radiology. 0319;
284(1):228-43.
2. Multifocal tree in bud nodular ground-glass airspace disease
elsewhere in the right lung, most consistent with nonspecific
inflammatory or infectious etiology, including chronic indolent
infection such as LATASHA. This could also be reassessed at the time of
follow-up.
3. Bibasilar band like areas of linear consolidation most consistent
with scarring. This accounts for the chest x-ray findings.
4.  Aortic Atherosclerosis (V17AH-[id number].Z).

These results will be called to the ordering clinician or
representative by the Radiologist Assistant, and communication
documented in the PACS or [REDACTED].

## 2022-12-21 ENCOUNTER — Telehealth: Payer: Self-pay | Admitting: Nurse Practitioner

## 2022-12-21 NOTE — Telephone Encounter (Signed)
Contacted Marissa Shannon to schedule their annual wellness visit. Patient declined to schedule AWV at this time.Moved to [place].  [name] Care Guide [facility] AWV TEAM Direct Dial: [phone]

## 2023-01-14 ENCOUNTER — Encounter: Payer: Self-pay | Admitting: Pulmonary Disease
